# Patient Record
Sex: Male | Born: 1970 | Race: White | Hispanic: No | Marital: Married | Smoking: Former smoker
Health system: Southern US, Community
[De-identification: ages and names within clinical notes are randomized; demographics above are authoritative.]

## PROBLEM LIST (undated history)

## (undated) DIAGNOSIS — K5792 Diverticulitis of intestine, part unspecified, without perforation or abscess without bleeding: Secondary | ICD-10-CM

## (undated) HISTORY — PX: MOUTH SURGERY: SHX715

---

## 2011-07-27 DIAGNOSIS — K5792 Diverticulitis of intestine, part unspecified, without perforation or abscess without bleeding: Secondary | ICD-10-CM

## 2011-07-27 HISTORY — DX: Diverticulitis of intestine, part unspecified, without perforation or abscess without bleeding: K57.92

## 2014-04-10 ENCOUNTER — Emergency Department (HOSPITAL_COMMUNITY)
Admission: EM | Admit: 2014-04-10 | Discharge: 2014-04-10 | Disposition: A | Discharge status: Home / Self Care | Payer: Self-pay | Attending: Emergency Medicine | Admitting: Emergency Medicine

## 2014-04-10 ENCOUNTER — Encounter (HOSPITAL_COMMUNITY): Payer: Self-pay | Admitting: Emergency Medicine

## 2014-04-10 DIAGNOSIS — K625 Hemorrhage of anus and rectum: Secondary | ICD-10-CM | POA: Insufficient documentation

## 2014-04-10 DIAGNOSIS — K612 Anorectal abscess: Secondary | ICD-10-CM | POA: Insufficient documentation

## 2014-04-10 DIAGNOSIS — K611 Rectal abscess: Secondary | ICD-10-CM

## 2014-04-10 DIAGNOSIS — K6289 Other specified diseases of anus and rectum: Secondary | ICD-10-CM | POA: Insufficient documentation

## 2014-04-10 HISTORY — DX: Diverticulitis of intestine, part unspecified, without perforation or abscess without bleeding: K57.92

## 2014-04-10 MED ORDER — CLINDAMYCIN HCL 150 MG PO CAPS: 150.0000 mg | ORAL_CAPSULE | Freq: Four times a day (QID) | ORAL | Status: DC

## 2014-04-10 NOTE — ED Provider Notes (Signed)
CSN: [id number]     Arrival date & time 04/10/14  0758 History   First MD Initiated Contact with Patient 04/10/14 [phone]     Chief Complaint  Patient presents with  . Rectal Pain  . Rectal Bleeding     (Consider location/radiation/quality/duration/timing/severity/associated sxs/prior Treatment) HPI  43 year old male presents c/o rectal pain and bleeding.  Patient reports gradual onset of rectal pain and bleeding for the past 5 days. He was seen at local hospital for this complaint 3 days ago and was diagnosed with internal hemorrhoid. He was instructed to take Preparation H, sitz baths, stool softener for this complaint. He however noticed increasing pain, rectal bleeding along with pustular discharge from rectum. Increasing pain with sitting down and with bowel movement. No associated fever, abdominal pain, back pain, or rash. He has history of external hemorrhoid. He has history of recurrent boils.  No history of cancer, no abnormal weight changes, night sweats, fever.  Past Medical History  Diagnosis Date  . Diverticulitis    History reviewed. No pertinent past surgical history. History reviewed. No pertinent family history. History  Substance Use Topics  . Smoking status: Not on file  . Smokeless tobacco: Not on file  . Alcohol Use: No    Review of Systems  Constitutional: Negative for fever.  Gastrointestinal: Positive for anal bleeding and rectal pain.  Skin: Negative for rash and wound.  Neurological: Negative for numbness.      Allergies  Review of patient's allergies indicates no known allergies.  Home Medications   Prior to Admission medications   Not on File   BP 124/67  Pulse 94  Temp(Src) 98.5 F (36.9 C) (Oral)  Resp 18  SpO2 100% Physical Exam  Constitutional: He appears well-developed and well-nourished. No distress.  HENT:  Head: Atraumatic.  Eyes: Conjunctivae are normal.  Neck: Normal range of motion. Neck supple.  Abdominal: Soft. There is no  tenderness.  Genitourinary:  Rectum: perirectal abscess at the 12 o'clock position, exquisite tenderness, pustular discharge mix with blood on manipulation.  Difficult to continue with examination due to pt's discomfort but pt has rectal tone and no other obvious mass  Neurological: He is alert.  Skin: No rash noted.  Psychiatric: He has a normal mood and affect.    ED Course  Procedures (including critical care time)  9:46 AM Pt has perirectal abscess.  I gave option of I&D in the ER vs. Being treated by [facility] for more of a definitely treatment.  Pt understand that perirectal abscess may have a higher rate of re accumulation when excise in ED due to it's complication.  Pt prefers [facility] treatment.  Will d/c with abx, clindamycin, recommend continue with sitz bath and preparation H.  Return precaution discussed.  Pt agrees with plan.    Labs Review Labs Reviewed - No data to display  Imaging Review No results found.   EKG Interpretation None      MDM   Final diagnoses:  Perirectal abscess    BP 124/67  Pulse 94  Temp(Src) 98.5 F (36.9 C) (Oral)  Resp 18  SpO2 100%     Fayrene Helper, PA-C 04/10/14 1010

## 2014-04-10 NOTE — ED Notes (Signed)
Reports having severe rectal pain and bleeding for several days. Has been seen by dr in [employer] and told it was hemorrhoids.

## 2014-04-10 NOTE — Discharge Instructions (Signed)
Please follow up closely with [facility] for further management of your peri-rectal abscess.  Continue with sitz bath, PreparationH, stool softener.  Return if your condition worsen or if you have other concerns.    Peri-Rectal Abscess Your caregiver has diagnosed you as having a peri-rectal abscess. This is an infected area near the rectum that is filled with pus. If the abscess is near the surface of the skin, your caregiver may open (incise) the area and drain the pus. HOME CARE INSTRUCTIONS   If your abscess was opened up and drained. A small piece of gauze may be placed in the opening so that it can drain. Do not remove the gauze unless directed by your caregiver.  A loose dressing may be placed over the abscess site. Change the dressing as often as necessary to keep it clean and dry.  After the drain is removed, the area may be washed with a gentle antiseptic (soap) four times per day.  A warm sitz bath, warm packs or heating pad may be used for pain relief, taking care not to burn yourself.  Return for a wound check in 1 day or as directed.  An "inflatable doughnut" may be used for sitting with added comfort. These can be purchased at a drugstore or medical supply house.  To reduce pain and straining with bowel movements, eat a high fiber diet with plenty of fruits and vegetables. Use stool softeners as recommended by your caregiver. This is especially important if narcotic type pain medications were prescribed as these may cause marked constipation.  Only take over-the-counter or prescription medicines for pain, discomfort, or fever as directed by your caregiver. SEEK IMMEDIATE MEDICAL CARE IF:   You have increasing pain that is not controlled by medication.  There is increased inflammation (redness), swelling, bleeding, or drainage from the area.  An oral temperature above 102 F (38.9 C) develops.  You develop chills or generalized malaise (feel lethargic or feel  "washed out").  You develop any new symptoms (problems) you feel may be related to your present problem. Document Released: 07/09/2000 Document Revised: 10/04/2011 Document Reviewed: 07/09/2008 [facility] Patient Information 2015 [place], [place]. This information is not intended to replace advice given to you by your health care provider. Make sure you discuss any questions you have with your health care provider.

## 2014-04-11 NOTE — ED Provider Notes (Signed)
Medical screening examination/treatment/procedure(s) were performed by non-physician practitioner and as supervising physician I was immediately available for consultation/collaboration.   EKG Interpretation None        Shon Baton, MD 04/11/14 [phone]

## 2014-04-12 ENCOUNTER — Emergency Department (HOSPITAL_COMMUNITY): Payer: Self-pay

## 2014-04-12 ENCOUNTER — Emergency Department (HOSPITAL_COMMUNITY)
Admission: EM | Admit: 2014-04-12 | Discharge: 2014-04-12 | Disposition: A | Discharge status: Home / Self Care | Payer: Self-pay | Attending: Emergency Medicine | Admitting: Emergency Medicine

## 2014-04-12 ENCOUNTER — Encounter (HOSPITAL_COMMUNITY): Payer: Self-pay | Admitting: Emergency Medicine

## 2014-04-12 DIAGNOSIS — Z87891 Personal history of nicotine dependence: Secondary | ICD-10-CM | POA: Insufficient documentation

## 2014-04-12 DIAGNOSIS — K6289 Other specified diseases of anus and rectum: Secondary | ICD-10-CM | POA: Insufficient documentation

## 2014-04-12 DIAGNOSIS — K5732 Diverticulitis of large intestine without perforation or abscess without bleeding: Secondary | ICD-10-CM | POA: Insufficient documentation

## 2014-04-12 LAB — CBC
HEMATOCRIT: 41.7 % (ref 39.0–52.0)
Hemoglobin: 14.3 g/dL (ref 13.0–17.0)
MCH: 28.7 pg (ref 26.0–34.0)
MCHC: 34.3 g/dL (ref 30.0–36.0)
MCV: 83.6 fL (ref 78.0–100.0)
PLATELETS: 250 10*3/uL (ref 150–400)
RBC: 4.99 MIL/uL (ref 4.22–5.81)
RDW: 12.3 % (ref 11.5–15.5)
WBC: 7.7 10*3/uL (ref 4.0–10.5)

## 2014-04-12 LAB — BASIC METABOLIC PANEL
ANION GAP: 9 (ref 5–15)
BUN: 13 mg/dL (ref 6–23)
CHLORIDE: 102 meq/L (ref 96–112)
CO2: 30 mEq/L (ref 19–32)
Calcium: 9.4 mg/dL (ref 8.4–10.5)
Creatinine, Ser: 0.85 mg/dL (ref 0.50–1.35)
GFR calc non Af Amer: >90 mL/min (ref >90)
Glucose, Bld: 91 mg/dL (ref 70–99)
Potassium: 4.8 mEq/L (ref 3.7–5.3)
Sodium: 141 mEq/L (ref 137–147)

## 2014-04-12 MED ORDER — TRAMADOL HCL 50 MG PO TABS: 50.0000 mg | ORAL_TABLET | Freq: Four times a day (QID) | ORAL | Status: DC | PRN

## 2014-04-12 MED ORDER — LIDOCAINE 5 % EX OINT
TOPICAL_OINTMENT | Freq: Once | CUTANEOUS | Status: AC
  Administered 2014-04-12: 13:00:00 via TOPICAL
  Filled 2014-04-12 (×2): qty 35.44

## 2014-04-12 MED ORDER — ONDANSETRON HCL 4 MG/2ML IJ SOLN
4.0000 mg | Freq: Once | INTRAMUSCULAR | Status: AC
  Administered 2014-04-12: 4 mg via INTRAVENOUS
  Filled 2014-04-12: qty 2

## 2014-04-12 MED ORDER — IOHEXOL 300 MG/ML  SOLN
100.0000 mL | Freq: Once | INTRAMUSCULAR | Status: AC | PRN
  Administered 2014-04-12: 100 mL via INTRAVENOUS

## 2014-04-12 MED ORDER — MORPHINE SULFATE 4 MG/ML IJ SOLN
4.0000 mg | Freq: Once | INTRAMUSCULAR | Status: DC
  Filled 2014-04-12: qty 1

## 2014-04-12 MED ORDER — SODIUM CHLORIDE 0.9 % IV BOLUS (SEPSIS)
1000.0000 mL | Freq: Once | INTRAVENOUS | Status: AC
  Administered 2014-04-12: 1000 mL via INTRAVENOUS

## 2014-04-12 NOTE — ED Notes (Signed)
Pt was to follow up with [facility]-- but office won't make an appt--due to insurance issues. They referred pt to health and wellness clinic. Has not received a phone call from clinic yet. Pt in severe pain, has been taking antibiotics and has received some relief. Open and draining at present. Using a doughnut ring for comfort.

## 2014-04-12 NOTE — ED Provider Notes (Signed)
History/physical exam/procedure(s) were performed by non-physician practitioner and as supervising physician I was immediately available for consultation/collaboration. I have reviewed all notes and am in agreement with care and plan.   Hilario Quarry, MD 04/12/14 [phone]

## 2014-04-12 NOTE — ED Provider Notes (Signed)
CSN: [id number]     Arrival date & time 04/12/14  4098 History   First MD Initiated Contact with Patient 04/12/14 0920     Chief Complaint  Patient presents with  . Rectal Pain     (Consider location/radiation/quality/duration/timing/severity/associated sxs/prior Treatment) HPI  Perry Hatfield 43 year old male who presents emergency department on a subsequent visit for rectal abscess. Patient was seen 2 days ago was started on oral clindamycin and discharged to followup with [facility]. Patient is uninsured and could not afford expected initial payment to be seen in the office. Patient returns today with continued severe pain, pain with defecation.  He states that pressure is somewhat improved and the abscess is draining. Patient endorses, nausea after beginning clinda, no abdominal pain, diarrhea or vomiting.  Past Medical History  Diagnosis Date  . Diverticulitis    History reviewed. No pertinent past surgical history. No family history on file. History  Substance Use Topics  . Smoking status: Former Games developer  . Smokeless tobacco: Not on file  . Alcohol Use: No    Review of Systems  ,Ten systems are reviewed and are negative for acute change except as noted in the HPI   Allergies  Review of patient's allergies indicates no known allergies.  Home Medications   Prior to Admission medications   Medication Sig Start Date End Date Taking? Authorizing Provider  clindamycin (CLEOCIN) 150 MG capsule Take 1 capsule (150 mg total) by mouth every 6 (six) hours. 04/10/14   Fayrene Helper, PA-C   BP 124/76  Pulse 68  Temp(Src) 98.1 F (36.7 C) (Oral)  Resp 18  Ht  (1.905 m)  Wt 231 lb 2 oz (104.838 kg)  BMI 28.89 kg/m2  SpO2 98% Physical Exam  Nursing note and vitals reviewed. Constitutional: He appears well-developed and well-nourished. No distress.  HENT:  Head: Normocephalic and atraumatic.  Eyes: Conjunctivae are normal. No scleral icterus.  Neck: Normal  range of motion. Neck supple.  Cardiovascular: Normal rate, regular rhythm and normal heart sounds.   Pulmonary/Chest: Effort normal and breath sounds normal. No respiratory distress.  Abdominal: Soft. There is no tenderness.  Genitourinary:  Indurated lump at 6 o'clock  Musculoskeletal: He exhibits no edema.  Neurological: He is alert.  Skin: Skin is warm and dry. He is not diaphoretic.  Psychiatric: His behavior is normal.    ED Course  Procedures (including critical care time) Labs Review Labs Reviewed  CBC  BASIC METABOLIC PANEL    Imaging Review No results found.   EKG Interpretation None      MDM   Final diagnoses:  Rectal pain   9:51 AM BP 124/76  Pulse 68  Temp(Src) 98.1 F (36.7 C) (Oral)  Resp 18  Ht  (1.905 m)  Wt 231 lb 2 oz (104.838 kg)  BMI 28.89 kg/m2  SpO2 98% Patient with rectal abscess. Labs, pain control and pelvic CT to r/o deeper abscess.Patient / Family / Caregiver understand and agree with initial ED impression and plan with expectations set for ED visit.   12:18 PM BP 112/50  Pulse 62  Temp(Src) 98.1 F (36.7 C) (Oral)  Resp 14  Ht  (1.905 m)  Wt 231 lb 2 oz (104.838 kg)  BMI 28.89 kg/m2  SpO2 100% Patient with resolving. Rectal abscess. No visible signs of abnormality on CT scan. Patient will be given lidocaine ointment and discharged with tramadol to add to his pain medication. He may follow up with the health  and wellness center as directed. Continue clindamycin. Discharge instructions given and return precautions discussed.  Arthor Captain, PA-C 04/12/14 1219

## 2014-04-12 NOTE — Discharge Planning (Signed)
[facility] Community Liaison  Follow up appointment made by this liaison for 04/19/2014 at 11:15 with the [facility]. Pt and family at bedside verbalized understanding of the upcoming appointment. Resource guide and my contact information provided for any future questions or concerns. No other Community Liaison needs identified at this time.

## 2014-04-12 NOTE — Discharge Instructions (Signed)
Please use the lidocaine no more than 3 times a day. There were no signs of active abscess on the CT scan and it appears to be improving.  Please continue to take your clindamycin. You may take the tramadol and the other pain medicine you've  Been given for pain relief. Follow up  As direted with the [facility].  Peri-Rectal Abscess Your caregiver has diagnosed you as having a peri-rectal abscess. This is an infected area near the rectum that is filled with pus. If the abscess is near the surface of the skin, your caregiver may open (incise) the area and drain the pus. HOME CARE INSTRUCTIONS   If your abscess was opened up and drained. A small piece of gauze may be placed in the opening so that it can drain. Do not remove the gauze unless directed by your caregiver.  A loose dressing may be placed over the abscess site. Change the dressing as often as necessary to keep it clean and dry.  After the drain is removed, the area may be washed with a gentle antiseptic (soap) four times per day.  A warm sitz bath, warm packs or heating pad may be used for pain relief, taking care not to burn yourself.  Return for a wound check in 1 day or as directed.  An "inflatable doughnut" may be used for sitting with added comfort. These can be purchased at a drugstore or medical supply house.  To reduce pain and straining with bowel movements, eat a high fiber diet with plenty of fruits and vegetables. Use stool softeners as recommended by your caregiver. This is especially important if narcotic type pain medications were prescribed as these may cause marked constipation.  Only take over-the-counter or prescription medicines for pain, discomfort, or fever as directed by your caregiver. SEEK IMMEDIATE MEDICAL CARE IF:   You have increasing pain that is not controlled by medication.  There is increased inflammation (redness), swelling, bleeding, or drainage from the area.  An  oral temperature above 102 F (38.9 C) develops.  You develop chills or generalized malaise (feel lethargic or feel "washed out").  You develop any new symptoms (problems) you feel may be related to your present problem. Document Released: 07/09/2000 Document Revised: 10/04/2011 Document Reviewed: 07/09/2008 [facility] Patient Information 2015 [place], [place]. This information is not intended to replace advice given to you by your health care provider. Make sure you discuss any questions you have with your health care provider.

## 2014-04-12 NOTE — ED Notes (Signed)
Pt c/o rectal pain onset Last Saturday. On Sunday pt began to have discharge from rectal area. Pt seen by PMD on Monday and was told he had a Hemorid. Pt seen here and told he has a perirectal abscess. Pt was referred to a surgeon but due to no insurance he could not be seen.

## 2014-04-19 ENCOUNTER — Ambulatory Visit: Payer: Self-pay | Attending: Family Medicine | Admitting: Family Medicine

## 2014-04-19 ENCOUNTER — Encounter: Payer: Self-pay | Admitting: Family Medicine

## 2014-04-19 VITALS — BP 103/69 | HR 67 | Temp 98.2°F | Resp 18 | Ht 75.0 in | Wt 231.0 lb

## 2014-04-19 DIAGNOSIS — L03317 Cellulitis of buttock: Principal | ICD-10-CM

## 2014-04-19 DIAGNOSIS — Z87891 Personal history of nicotine dependence: Secondary | ICD-10-CM | POA: Insufficient documentation

## 2014-04-19 DIAGNOSIS — K602 Anal fissure, unspecified: Secondary | ICD-10-CM | POA: Insufficient documentation

## 2014-04-19 DIAGNOSIS — R935 Abnormal findings on diagnostic imaging of other abdominal regions, including retroperitoneum: Secondary | ICD-10-CM | POA: Insufficient documentation

## 2014-04-19 DIAGNOSIS — L0231 Cutaneous abscess of buttock: Secondary | ICD-10-CM | POA: Insufficient documentation

## 2014-04-19 MED ORDER — DOXYCYCLINE HYCLATE 100 MG PO TABS: 100.0000 mg | ORAL_TABLET | Freq: Two times a day (BID) | ORAL | Status: DC

## 2014-04-19 NOTE — Addendum Note (Signed)
Addended by: Dessa Phi on: 04/19/2014 02:08 PM   Modules accepted: Level of Service

## 2014-04-19 NOTE — Progress Notes (Signed)
Establish Care HFU Abscess on rectal area, pt state has pain with BM

## 2014-04-19 NOTE — Patient Instructions (Addendum)
Perry Hatfield,  Thank you for coming in today. It was a pleasure meeting you. I look forward to being your primary doctor.  Abscess is improving:  Plan Change from clindamycin to doxycyline since not tolerating clindamycin Continue stool softener with goal of 1 soft bowel movement daily.   Return in 2-3 weeks for recheck, sooner if needed   Will plan for f/u CT pelvis in 4-6 weeks  Dr. Karie Fetch Fissure, Adult An anal fissure is a small tear or crack in the skin around the anus. Bleeding from a fissure usually stops on its own within a few minutes. However, bleeding will often reoccur with each bowel movement until the crack heals.  CAUSES   Passing large, hard stools.  Frequent diarrheal stools.  Constipation.  Inflammatory bowel disease (Crohn's disease or ulcerative colitis).  Infections.  Anal sex. SYMPTOMS   Small amounts of blood seen on your stools, on toilet paper, or in the toilet after a bowel movement.  Rectal bleeding.  Painful bowel movements.  Itching or irritation around the anus. DIAGNOSIS Your caregiver will examine the anal area. An anal fissure can usually be seen with careful inspection. A rectal exam may be performed and a short tube (anoscope) may be used to examine the anal canal. TREATMENT   You may be instructed to take fiber supplements. These supplements can soften your stool to help make bowel movements easier.  Sitz baths may be recommended to help heal the tear. Do not use soap in the sitz baths.  A medicated cream or ointment may be prescribed to lessen discomfort. HOME CARE INSTRUCTIONS   Maintain a diet high in fruits, whole grains, and vegetables. Avoid constipating foods like bananas and dairy products.  Take sitz baths as directed by your caregiver.  Drink enough fluids to keep your urine clear or pale yellow.  Only take over-the-counter or prescription medicines for pain, discomfort, or fever as directed by your  caregiver. Do not take aspirin as this may increase bleeding.  Do not use ointments containing numbing medications (anesthetics) or hydrocortisone. They could slow healing. SEEK MEDICAL CARE IF:   Your fissure is not completely healed within 3 days.  You have further bleeding.  You have a fever.  You have diarrhea mixed with blood.  You have pain.  Your problem is getting worse rather than better. MAKE SURE YOU:   Understand these instructions.  Will watch your condition.  Will get help right away if you are not doing well or get worse. Document Released: 07/12/2005 Document Revised: 10/04/2011 Document Reviewed: 12/27/2010 [facility] Patient Information 2015 [place], [place]. This information is not intended to replace advice given to you by your health care provider. Make sure you discuss any questions you have with your health care provider.

## 2014-04-19 NOTE — Progress Notes (Signed)
   Subjective:    Patient ID: Perry Hatfield, male    DOB: 07/17/1971, 43 y.o.   MRN: [id number] CC: establish care, ED f/u for perirectal abscess  HPI 43 year old male presents to establish care discussed the following:  #1 perirectal abscess: Patient with apparent absence of symptoms started on 04/10/2014. He noticed pain and bleeding from his rectum. He was initially diagnosed with internal hemorrhoids. His been diagnosed with perirectal abscess. There is draining spontaneously. He is taking a few clindamycin is not taking as prescribed due to GI upset. No fever, chills, nausea, vomiting. Pain is greatly improved.  Soc hx: former smoker, quit  Review of Systems As per HPI     Objective:   Physical Exam BP 103/69  Pulse 67  Temp(Src) 98.2 F (36.8 C) (Oral)  Resp 18  Ht  (1.905 m)  Wt 231 lb (104.781 kg)  BMI 28.87 kg/m2  SpO2 98% General appearance: alert, cooperative and no distress Rectal: indurated area at 12 o'clock perirectal, no drainage, no fluctuance, fissure at 12 o'clock, increased rectal tone.    CT Pelvis with Contrast 04/12/14: Negative for perirectal abscess  Sigmoid diverticulosis  Questionable area in the right lateral wall the rectum measuring 1 cm which shows hyper enhancement. This could be normal tissue or possibly a polyp or mass.      Assessment & Plan:

## 2014-04-19 NOTE — Assessment & Plan Note (Signed)
A: persistent area of induration, no fluctuance, pain significantly improved. Fissure/draining tract opening noted on exam.  P: Change from clindamycin to doxycyline since not tolerating clindamycin Continue stool softener with goal of 1 soft bowel movement daily.  Return in 2-3 weeks for recheck, sooner if needed   Will get f/u CT pelvis with contrast in 4-6 weeks to re-evaluate are of concerns on R lateral rectum

## 2014-05-06 ENCOUNTER — Ambulatory Visit: Payer: Self-pay | Admitting: Family Medicine

## 2014-05-17 ENCOUNTER — Encounter: Payer: Self-pay | Admitting: Family Medicine

## 2014-05-17 ENCOUNTER — Ambulatory Visit (HOSPITAL_COMMUNITY): Payer: BC Managed Care – PPO

## 2014-05-17 ENCOUNTER — Ambulatory Visit: Payer: BC Managed Care – PPO | Attending: Family Medicine | Admitting: Family Medicine

## 2014-05-17 VITALS — BP 124/78 | HR 77 | Temp 97.9°F | Resp 18 | Ht 73.5 in | Wt 233.0 lb

## 2014-05-17 DIAGNOSIS — Z87891 Personal history of nicotine dependence: Secondary | ICD-10-CM | POA: Insufficient documentation

## 2014-05-17 DIAGNOSIS — K649 Unspecified hemorrhoids: Secondary | ICD-10-CM | POA: Insufficient documentation

## 2014-05-17 DIAGNOSIS — L0231 Cutaneous abscess of buttock: Secondary | ICD-10-CM | POA: Diagnosis not present

## 2014-05-17 DIAGNOSIS — Z6830 Body mass index (BMI) 30.0-30.9, adult: Secondary | ICD-10-CM | POA: Insufficient documentation

## 2014-05-17 DIAGNOSIS — J069 Acute upper respiratory infection, unspecified: Secondary | ICD-10-CM | POA: Insufficient documentation

## 2014-05-17 DIAGNOSIS — K643 Fourth degree hemorrhoids: Secondary | ICD-10-CM

## 2014-05-17 DIAGNOSIS — H9201 Otalgia, right ear: Secondary | ICD-10-CM | POA: Insufficient documentation

## 2014-05-17 DIAGNOSIS — K644 Residual hemorrhoidal skin tags: Secondary | ICD-10-CM | POA: Diagnosis not present

## 2014-05-17 NOTE — Progress Notes (Signed)
   Subjective:    Patient ID: Perry Hatfield, male    DOB: [birth year], 43 y.o.   MRN: [id number] CC: f/u rectal bleeding in the setting of gluteal abscess, ? Ear infection  HPI 43 yo M presents for f/u visit:  1. Gluteal abscess: had return of drainage, scant, bloody. Intermittent sharp pains. No fever. Still with hard stools but improved with daily stool softener.   2. R ear ache: x 3 days. No drainage. No fever. Also with mild sore throat. No exudate. Sick contact of daughter at home. Taking honey.   Soc hx: former smoker, quit  Review of Systems As per HPI      Objective:   Physical Exam BP 124/78  Pulse 77  Temp(Src) 97.9 F (36.6 C) (Oral)  Resp 18  Ht 6' 1.5" (1.867 m)  Wt 233 lb (105.688 kg)  BMI 30.32 kg/m2  SpO2 97% Wt Readings from Last 3 Encounters:  05/17/14 233 lb (105.688 kg)  04/19/14 231 lb (104.781 kg)  04/12/14 231 lb 2 oz (104.838 kg)  General appearance: alert, cooperative and no distress Ears: normal TM's and external ear canals both ears Nose: no discharge, turbinates pink, swollen Throat: lips, mucosa, and tongue normal; teeth and gums normal Gluteal: external hemorrhoids w/o thrombosis, no active bleeding. No skin tears.       Assessment & Plan:

## 2014-05-17 NOTE — Assessment & Plan Note (Signed)
A; hemorrhoids with intermittent symptoms no thrombosis P: Stool softener Sitz baths Tucks Referral to gen surg prn worsening symptoms

## 2014-05-17 NOTE — Patient Instructions (Signed)
Mr. Adriana SimasCook,  Thank you for coming back for follow up.  1. Please go for CT pelvis to f/u resolution of abscess.   2. Hemorrhoids: you have hemorrhoids on exam. Tucks wipes, sitz baths, stool softeners. If you have worsening pain, bleeding or fecal leakage I recommend referral to general surgery and will be happy to place it.   3. Upper respiratory infection: Continue honey, lemon, tea rest.  Tylenol or motrin for pain.  F/u as needed if symptoms worsen.   Dr. Armen PickupFunches

## 2014-05-17 NOTE — Assessment & Plan Note (Signed)
A: mild symptoms P:  Supportive care

## 2014-05-17 NOTE — Assessment & Plan Note (Addendum)
A: resolved based on exam. P:  F/u CT scan  Repeat BMP today in anticipation of CT with contrast.

## 2014-05-17 NOTE — Progress Notes (Signed)
F/U rectal bleeding Complaining of possible ear infection

## 2014-05-18 LAB — BASIC METABOLIC PANEL
BUN: 11 mg/dL (ref 6–23)
CO2: 29 mEq/L (ref 19–32)
Calcium: 9.1 mg/dL (ref 8.4–10.5)
Chloride: 104 mEq/L (ref 96–112)
Creat: 0.78 mg/dL (ref 0.50–1.35)
Glucose, Bld: 73 mg/dL (ref 70–99)
POTASSIUM: 4.2 meq/L (ref 3.5–5.3)
Sodium: 141 mEq/L (ref 135–145)

## 2014-05-20 ENCOUNTER — Telehealth: Payer: Self-pay | Admitting: *Deleted

## 2014-05-20 NOTE — Telephone Encounter (Signed)
Message copied by Dyann KiefGIRALDEZ, Preslea Rhodus M on Mon May 20, 2014  2:51 PM ------      Message from: [name], JOSALYN      Created: Mon May 20, 2014  2:10 PM       Normal BMP (specifically Cr) patient ok to have CT pelvis with contrast ------

## 2014-05-20 NOTE — Telephone Encounter (Signed)
Unable to contact patient, voicemail full

## 2014-05-24 ENCOUNTER — Ambulatory Visit (HOSPITAL_COMMUNITY): Payer: BC Managed Care – PPO

## 2014-05-31 ENCOUNTER — Telehealth: Payer: Self-pay | Admitting: Family Medicine

## 2014-05-31 ENCOUNTER — Ambulatory Visit (HOSPITAL_COMMUNITY)
Admission: RE | Admit: 2014-05-31 | Discharge: 2014-05-31 | Disposition: A | Discharge status: Home / Self Care | Payer: BC Managed Care – PPO | Source: Ambulatory Visit | Attending: Family Medicine | Admitting: Family Medicine

## 2014-05-31 DIAGNOSIS — L0231 Cutaneous abscess of buttock: Secondary | ICD-10-CM | POA: Insufficient documentation

## 2014-05-31 DIAGNOSIS — R935 Abnormal findings on diagnostic imaging of other abdominal regions, including retroperitoneum: Secondary | ICD-10-CM

## 2014-05-31 DIAGNOSIS — K5730 Diverticulosis of large intestine without perforation or abscess without bleeding: Secondary | ICD-10-CM

## 2014-05-31 MED ORDER — IOHEXOL 300 MG/ML  SOLN
100.0000 mL | Freq: Once | INTRAMUSCULAR | Status: AC | PRN
Start: 2014-05-31 — End: 2014-05-31
  Administered 2014-05-31: 100 mL via INTRAVENOUS

## 2014-05-31 NOTE — Assessment & Plan Note (Signed)
A: persistent on f/u CT no diverticulitis P: GI referral

## 2014-05-31 NOTE — Telephone Encounter (Signed)
Called patient. We discussed CT findings. "Continued presence of 14 x 9 mm oval-shaped soft tissue density in the rectum which does not appear to be significantly changed compared to prior exam. While this may represent a prominent rectal fold, polyp cannot be excluded and further evaluation with sigmoidoscopy is recommended."  Patent also noted to have persistent sigmoid diverticulosis w/o diverticulitis.   Plan: Referral to GI for sigmoidoscopy/colonoscopy Patient now has health insurance through his work.

## 2014-05-31 NOTE — Assessment & Plan Note (Addendum)
A: persistent soft tissue denisty in rectum measuring 14 x 9 mm on CT scan x 2, 8 weeks apart.  "Continued presence of 14 x 9 mm oval-shaped soft tissue density inthe rectum which does not appear to be significantly changed compared to prior exam. While this may represent a prominent rectal fold, polyp cannot be excluded and further evaluation with sigmoidoscopy is recommended" P:  Referral to GI to evaluate this area as well as the sigmoid diverticulosis

## 2014-06-06 NOTE — Telephone Encounter (Signed)
He has [employer]  04/25/2014 - 06/03/2014

## 2014-08-01 ENCOUNTER — Encounter: Payer: Self-pay | Admitting: Family Medicine

## 2014-09-05 ENCOUNTER — Ambulatory Visit: Payer: Federal, State, Local not specified - PPO | Attending: Family Medicine | Admitting: Family Medicine

## 2014-09-05 VITALS — BP 103/67 | HR 90 | Temp 98.4°F | Resp 18

## 2014-09-05 DIAGNOSIS — R509 Fever, unspecified: Secondary | ICD-10-CM | POA: Diagnosis not present

## 2014-09-05 DIAGNOSIS — Z87891 Personal history of nicotine dependence: Secondary | ICD-10-CM | POA: Insufficient documentation

## 2014-09-05 DIAGNOSIS — J209 Acute bronchitis, unspecified: Secondary | ICD-10-CM

## 2014-09-05 DIAGNOSIS — R0989 Other specified symptoms and signs involving the circulatory and respiratory systems: Secondary | ICD-10-CM | POA: Insufficient documentation

## 2014-09-05 DIAGNOSIS — R05 Cough: Secondary | ICD-10-CM | POA: Insufficient documentation

## 2014-09-05 DIAGNOSIS — F17210 Nicotine dependence, cigarettes, uncomplicated: Secondary | ICD-10-CM

## 2014-09-05 MED ORDER — AMOXICILLIN 500 MG PO CAPS: 500.0000 mg | ORAL_CAPSULE | Freq: Two times a day (BID) | ORAL | Status: AC

## 2014-09-05 MED ORDER — ALBUTEROL SULFATE HFA 108 (90 BASE) MCG/ACT IN AERS: 2.0000 | INHALATION_SPRAY | Freq: Four times a day (QID) | RESPIRATORY_TRACT | Status: AC | PRN

## 2014-09-05 MED ORDER — PREDNISONE 10 MG PO TABS: 50.0000 mg | ORAL_TABLET | Freq: Every day | ORAL | Status: AC

## 2014-09-05 NOTE — Patient Instructions (Signed)

## 2014-09-05 NOTE — Progress Notes (Signed)
Patient walked in c/o chest congestion, cough, sometimes productive of brown sputum, fever, chills, aches, ear pain L > R, nasal congestion,   Patient denies sore throat, and nasal drainage Patient states he is a former smoker ( 1+ ppd X 30 years; quit 2 years ago) Vaping daily since quit smoking Father recently dx with stage 4 lung cancer  BP 103/67 P 90 T  98.4 oral R 18 SPO2 97%  Pt c/o tenderness upon palpation of maxillary and frontal sinuses No cervical lymphadenopathy noted Ears: right canal with cerumen but without redness or drainage, left canal with cerumen and redness; tympanic membranes intact without redness or bulging Throat: clear with redness but no drainage Nose: nares red and engorged with clear drainage noted Lungs: wheezing and stridor noted on expiration both upper lobes and right middle lobe  Patient encouraged to rest, drink plenty of fluids (water), may take OTC mucinex prn  Per PCP: Prednisone 50 mg daily X 5 days Amoxicillin 500 mg bid X 10 days (Patient states diarrhea with pcn but states he does fine with amoxicillin) Albuterol MDI prn CXR in next couple of weeks F/u with PCP 7 days prn  Call back if sx worsen or fail to improve

## 2014-09-26 ENCOUNTER — Ambulatory Visit: Payer: Federal, State, Local not specified - PPO | Attending: Family Medicine | Admitting: Family Medicine

## 2014-09-26 ENCOUNTER — Telehealth: Payer: Self-pay | Admitting: *Deleted

## 2014-09-26 ENCOUNTER — Encounter: Payer: Self-pay | Admitting: Family Medicine

## 2014-09-26 ENCOUNTER — Ambulatory Visit (HOSPITAL_COMMUNITY)
Admission: RE | Admit: 2014-09-26 | Discharge: 2014-09-26 | Disposition: A | Discharge status: Home / Self Care | Payer: Federal, State, Local not specified - PPO | Source: Ambulatory Visit | Attending: Family Medicine | Admitting: Family Medicine

## 2014-09-26 VITALS — BP 118/74 | HR 60 | Temp 98.0°F | Resp 16 | Ht 72.0 in | Wt 231.0 lb

## 2014-09-26 DIAGNOSIS — R0989 Other specified symptoms and signs involving the circulatory and respiratory systems: Secondary | ICD-10-CM | POA: Insufficient documentation

## 2014-09-26 DIAGNOSIS — R05 Cough: Secondary | ICD-10-CM | POA: Insufficient documentation

## 2014-09-26 DIAGNOSIS — R5381 Other malaise: Secondary | ICD-10-CM | POA: Insufficient documentation

## 2014-09-26 DIAGNOSIS — F17210 Nicotine dependence, cigarettes, uncomplicated: Secondary | ICD-10-CM

## 2014-09-26 DIAGNOSIS — R059 Cough, unspecified: Secondary | ICD-10-CM | POA: Insufficient documentation

## 2014-09-26 DIAGNOSIS — R079 Chest pain, unspecified: Secondary | ICD-10-CM | POA: Insufficient documentation

## 2014-09-26 DIAGNOSIS — R935 Abnormal findings on diagnostic imaging of other abdominal regions, including retroperitoneum: Secondary | ICD-10-CM

## 2014-09-26 NOTE — Telephone Encounter (Signed)
Let patient know his chest xray was negative and he should continue to take otc medications for his symptom relief (per NP Concepcion LivingLinda Bernhardt)

## 2014-09-26 NOTE — Assessment & Plan Note (Signed)
Objectively: The patient is alert oriented and appropriate in no acute distress TMs appear to have a small amount of fluid there is no redness no bulging is congestion the passages do not appear overly inflamed but there is some mucus present stretch has some mild erythema throughout his neck is supple for range of motion without adenopathy. Lungs have crackles in the right lower lobe heard posteriorly and laterally. His heart sounds are regular without murmur gallop  Plan and sending him for a chest x-ray and will make treatment decisions based on those results will also give him a note for work for the rest of the week to go back on Monday

## 2014-09-26 NOTE — Progress Notes (Signed)
Patient dx 2 weeks ago with bronchitis and was given Doxycycline and prednisone and albuterol. Patient finished medications and stated he felt better for a couple of days but feels bad again C/o sinus congestions, pressure, headache an pain in his chest with cough

## 2014-10-01 ENCOUNTER — Telehealth: Payer: Self-pay | Admitting: *Deleted

## 2014-10-01 NOTE — Telephone Encounter (Signed)
-----   Message from Lora PaulaJosalyn C Funches, MD sent at 09/26/2014  9:11 PM EST ----- Normal CXR

## 2014-10-01 NOTE — Telephone Encounter (Signed)
Pt aware.

## 2014-10-15 NOTE — Progress Notes (Signed)
Patient ID: Perry Hatfield, male   DOB: April 26, 1971, 44 y.o.   MRN: [id number] I have reviewed the note and agree.  [name],[name]

## 2015-09-26 IMAGING — CT CT PELVIS W/ CM
2 of 3 series · 17 of 46 positions shown, 19 images · IV contrast (Omni 300)
Comparison: None.

CLINICAL DATA: Rectal abscess

EXAM:
CT PELVIS WITH CONTRAST
TECHNIQUE: Multidetector CT imaging of the pelvis was performed using the
standard protocol following the bolus administration of intravenous
contrast.
CONTRAST:  100mL OMNIPAQUE IOHEXOL 300 MG/ML  SOLN

[Series 2: abd/ pelvis 5.0 i30f 1 · axial · 0.70mm/px · z∈[+796,+1046]mm · 14 of 58 slices shown, 16 images]
[im 4/58  soft-tissue]
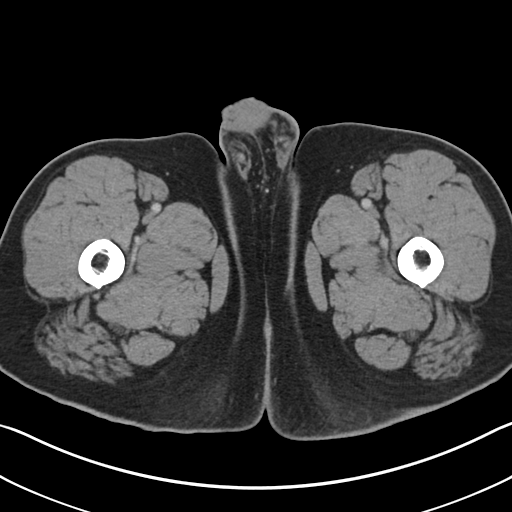
[im 4/58  bone]
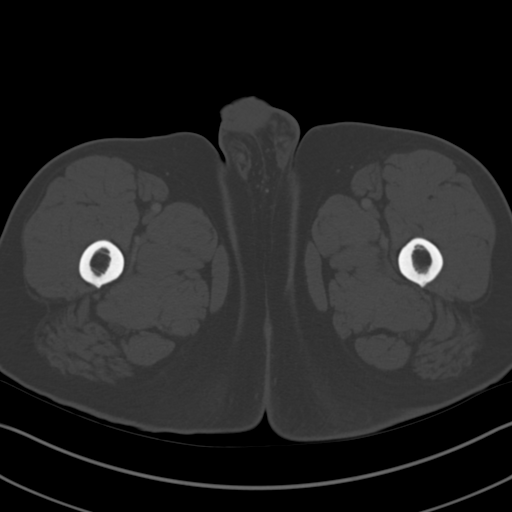
[im 8/58  soft-tissue]
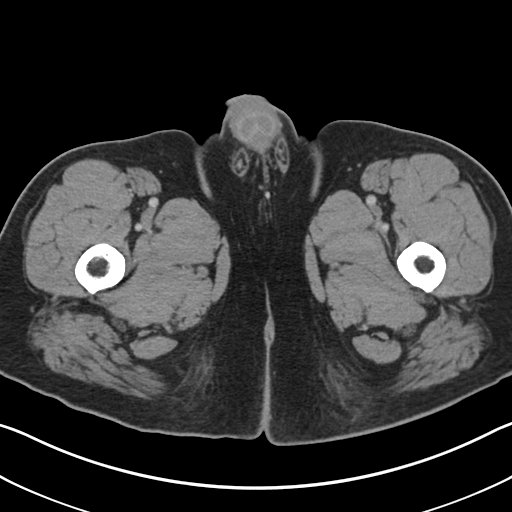
[im 12/58  soft-tissue]
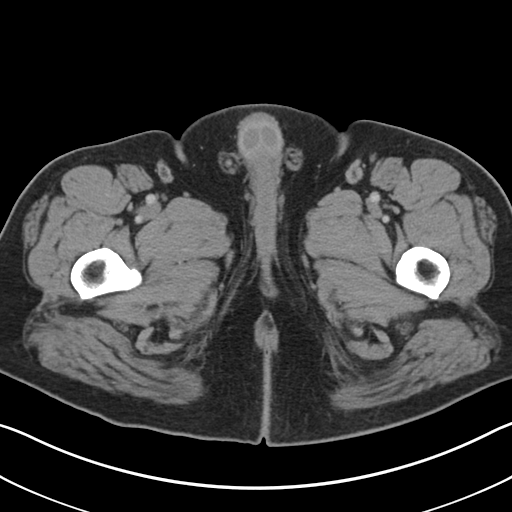
[im 15/58  soft-tissue]
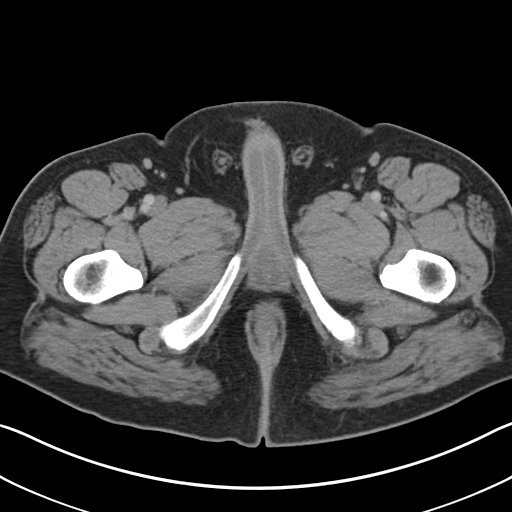
[im 19/58  soft-tissue]
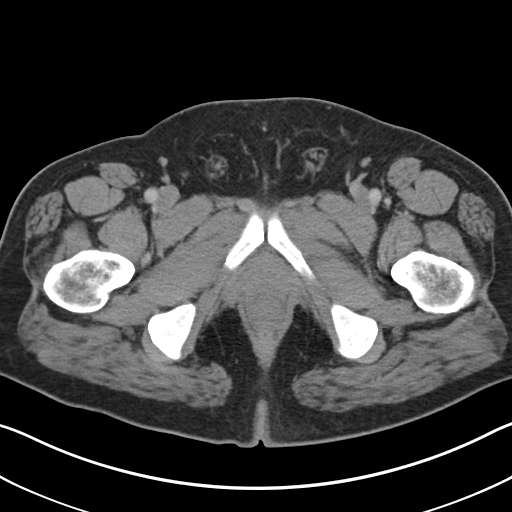
[im 23/58  soft-tissue]
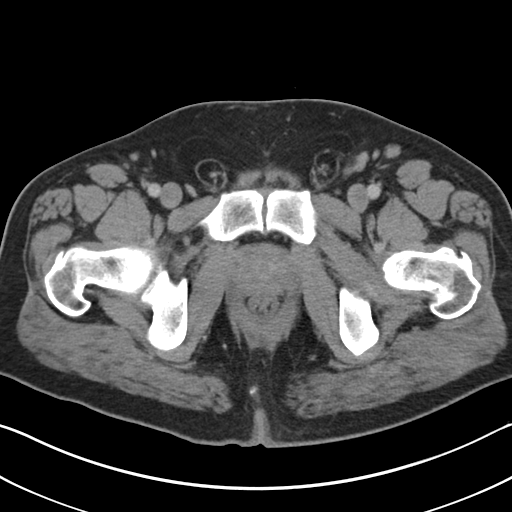
[im 26/58  soft-tissue]
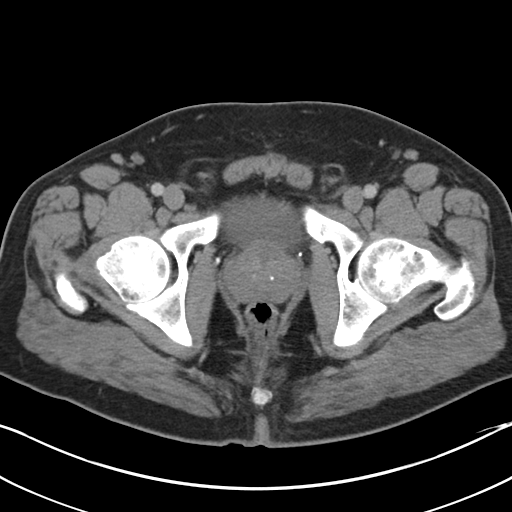
[im 32/58  soft-tissue]
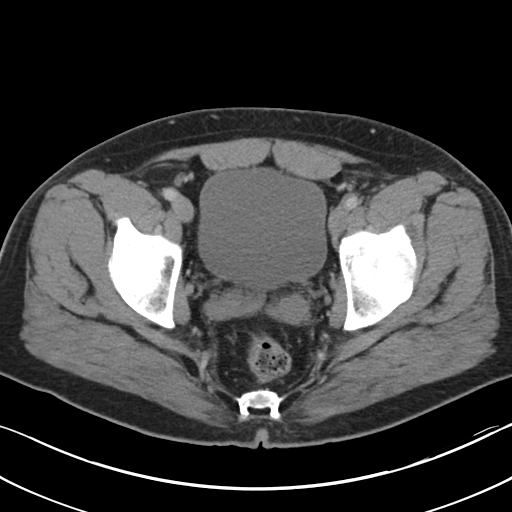
[im 35/58  soft-tissue]
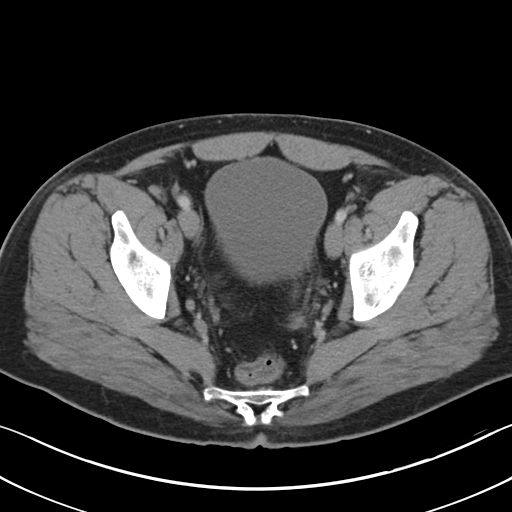
[im 35/58  bone]
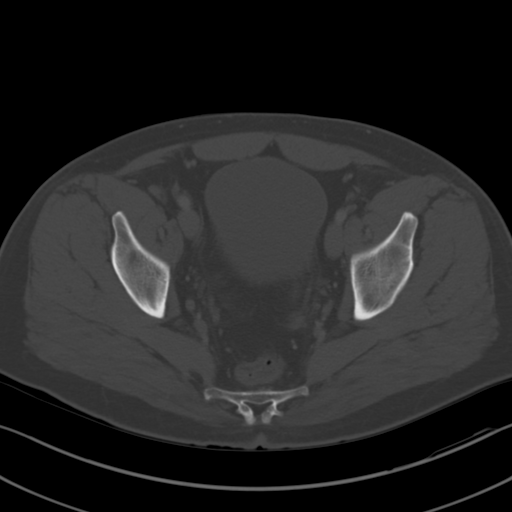
[im 39/58  soft-tissue]
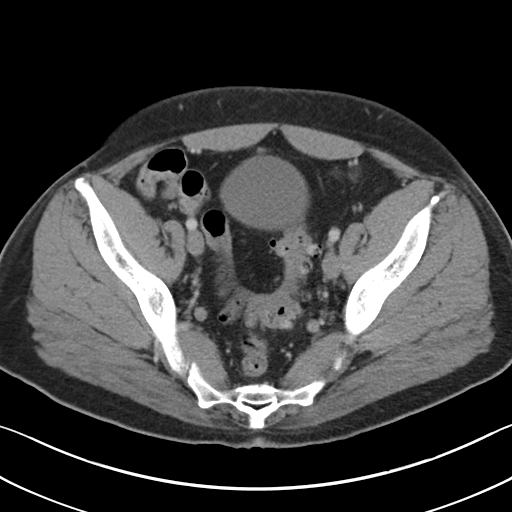
[im 43/58  soft-tissue]
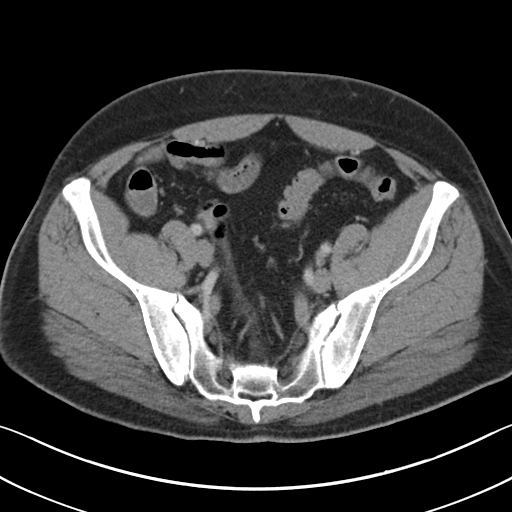
[im 46/58  soft-tissue]
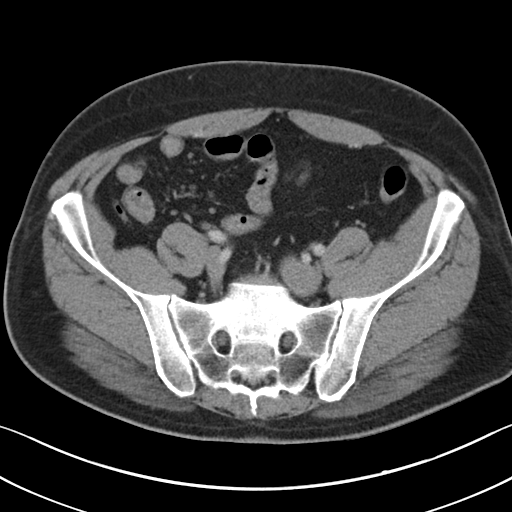
[im 50/58  soft-tissue]
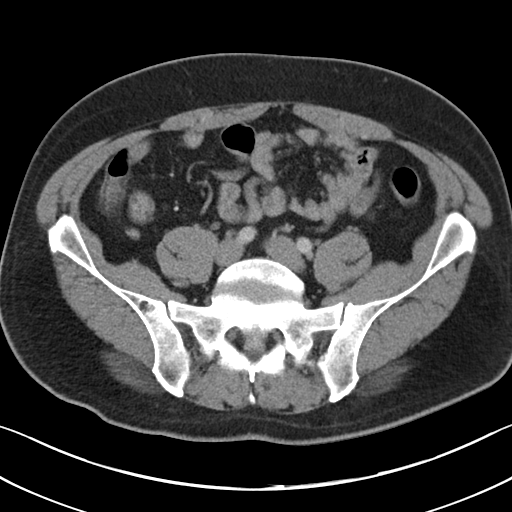
[im 54/58  soft-tissue]
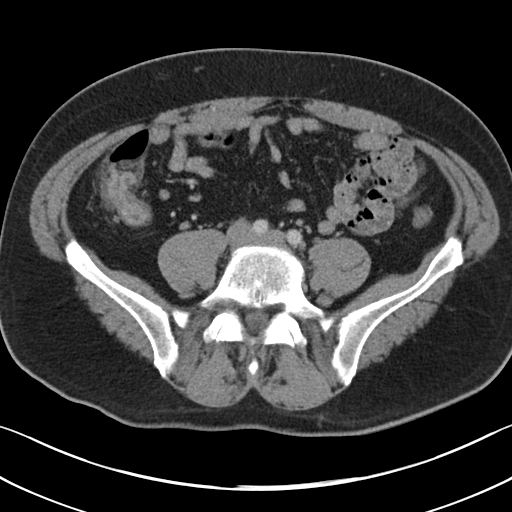

[Series 4: coronals · coronal · 0.58mm/px · 3 of 110 slices shown]
[im 37/110  soft-tissue]
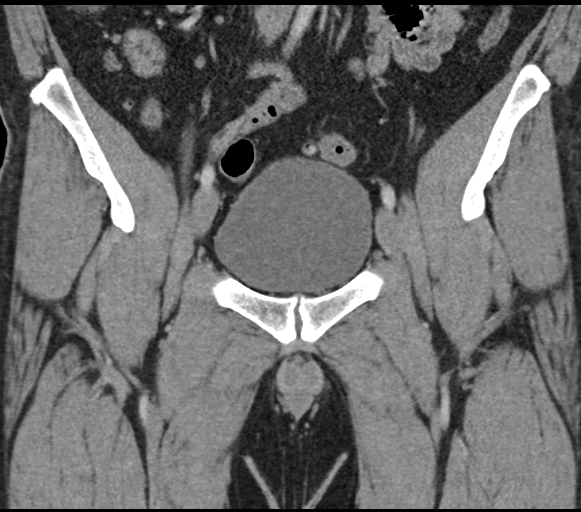
[im 49/110  soft-tissue]
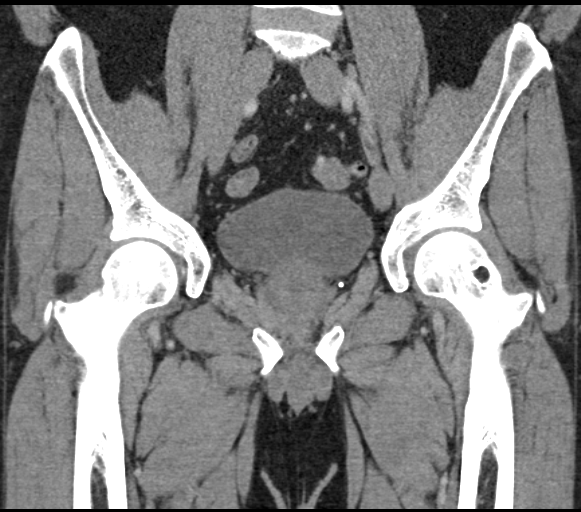
[im 61/110  soft-tissue]
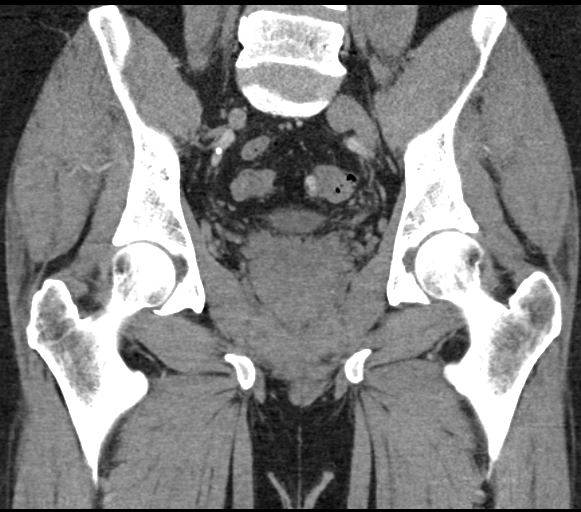

[17 of 46 positions shown; findings below may reference images not displayed]

FINDINGS: The abdomen was not scanned.

Sigmoid diverticulosis without evidence of diverticulitis.
Visualized bowel is not dilated. Appendix is normal.

1 cm area of hyper enhancement along the right lateral wall of the
rectum of questionable significance. This could represent a polyp,
mass or area of infection. No circumferential thickening of the
rectum. No perirectal abscess is identified.

The prostate is moderately large with prostate calcifications.
Urinary bladder is normal.

No adenopathy or free fluid in the pelvis.  No acute bony change.
IMPRESSION: Negative for perirectal abscess

Sigmoid diverticulosis

Questionable area in the right lateral wall the rectum measuring 1
cm which shows hyper enhancement. This could be normal tissue or
possibly a polyp or mass.

## 2016-03-11 IMAGING — CR DG CHEST 2V
2 series · 2 of 2 positions shown · non-contrast
Comparison: None.

CLINICAL DATA: History of smoking, cough, congestion, some chest
pain

EXAM:
CHEST  2 VIEW

[chest pa]
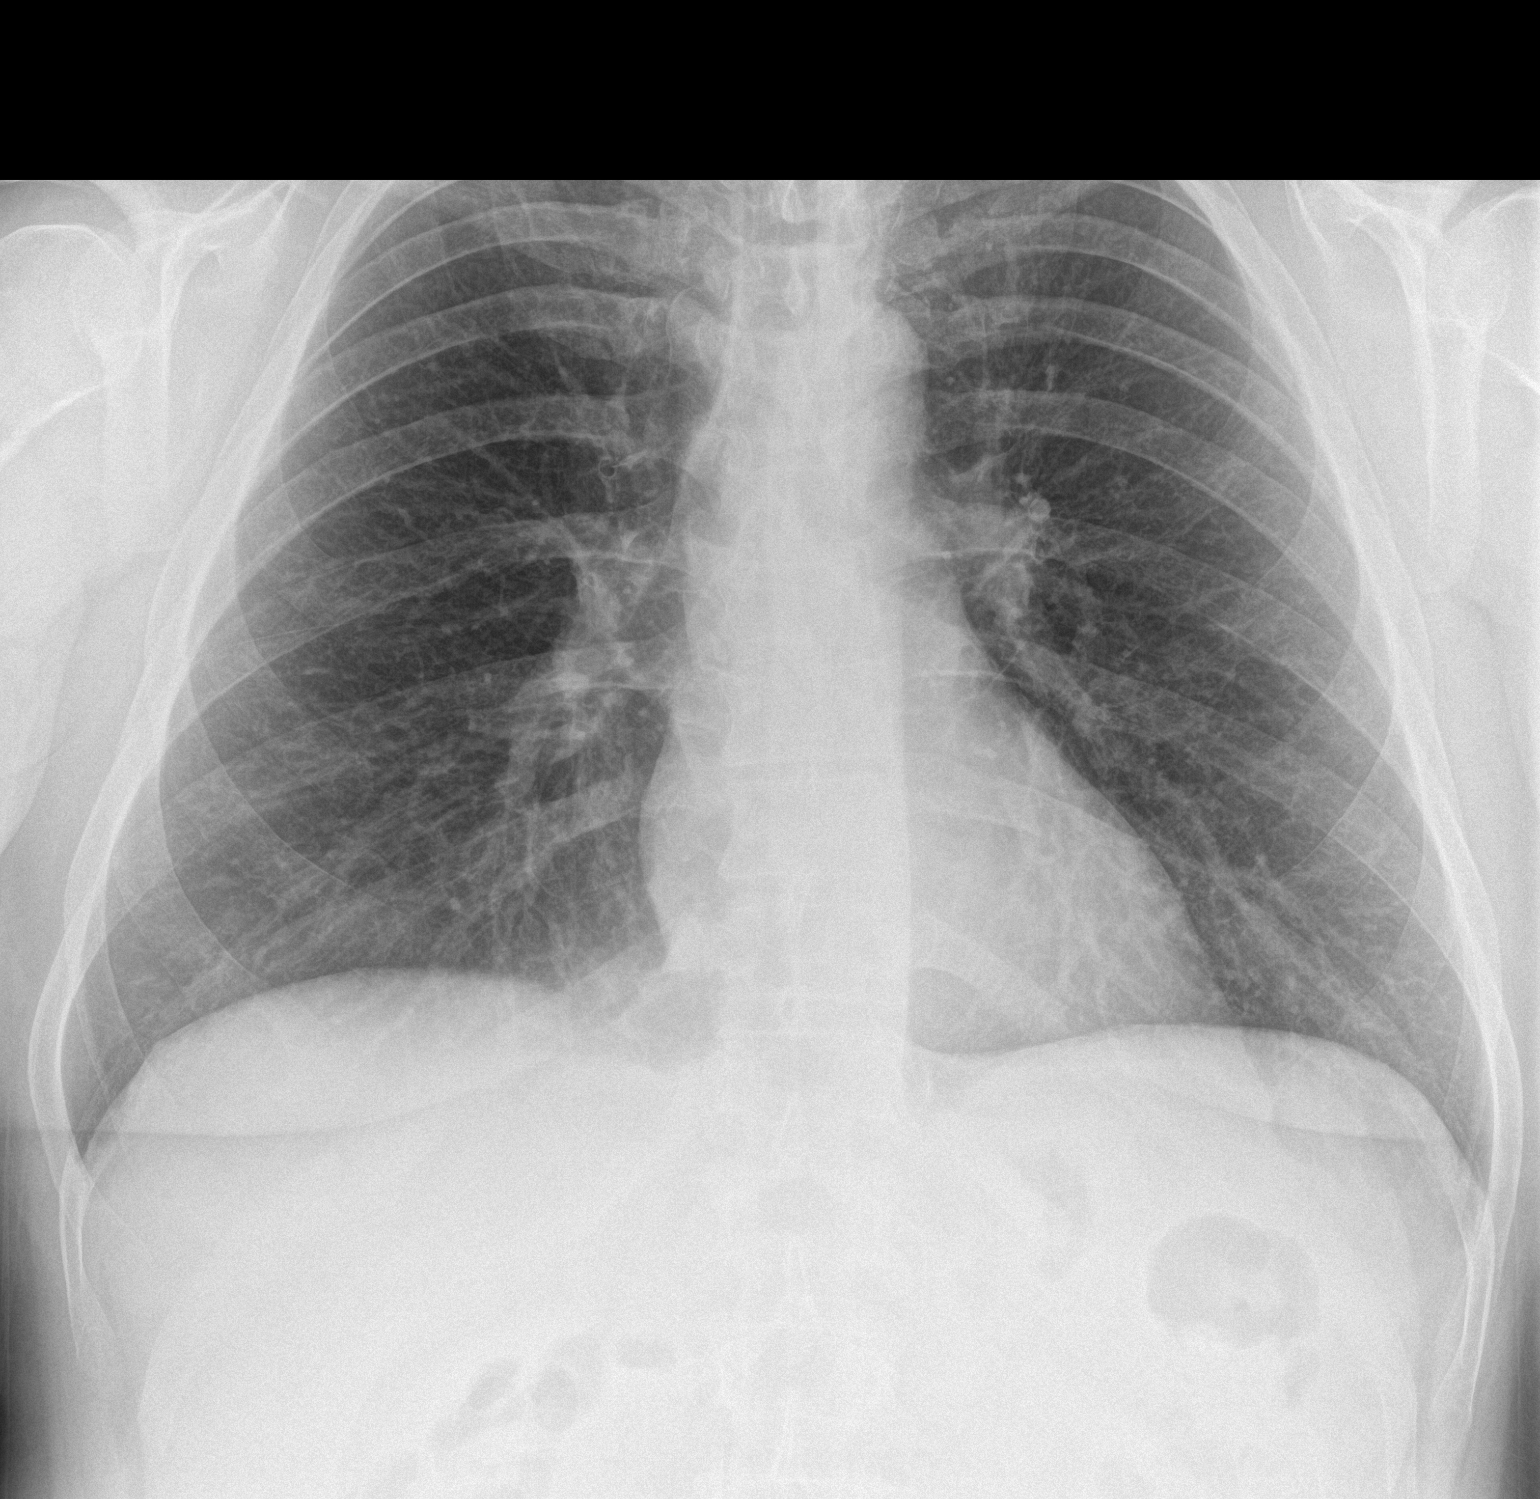

[chest lat]
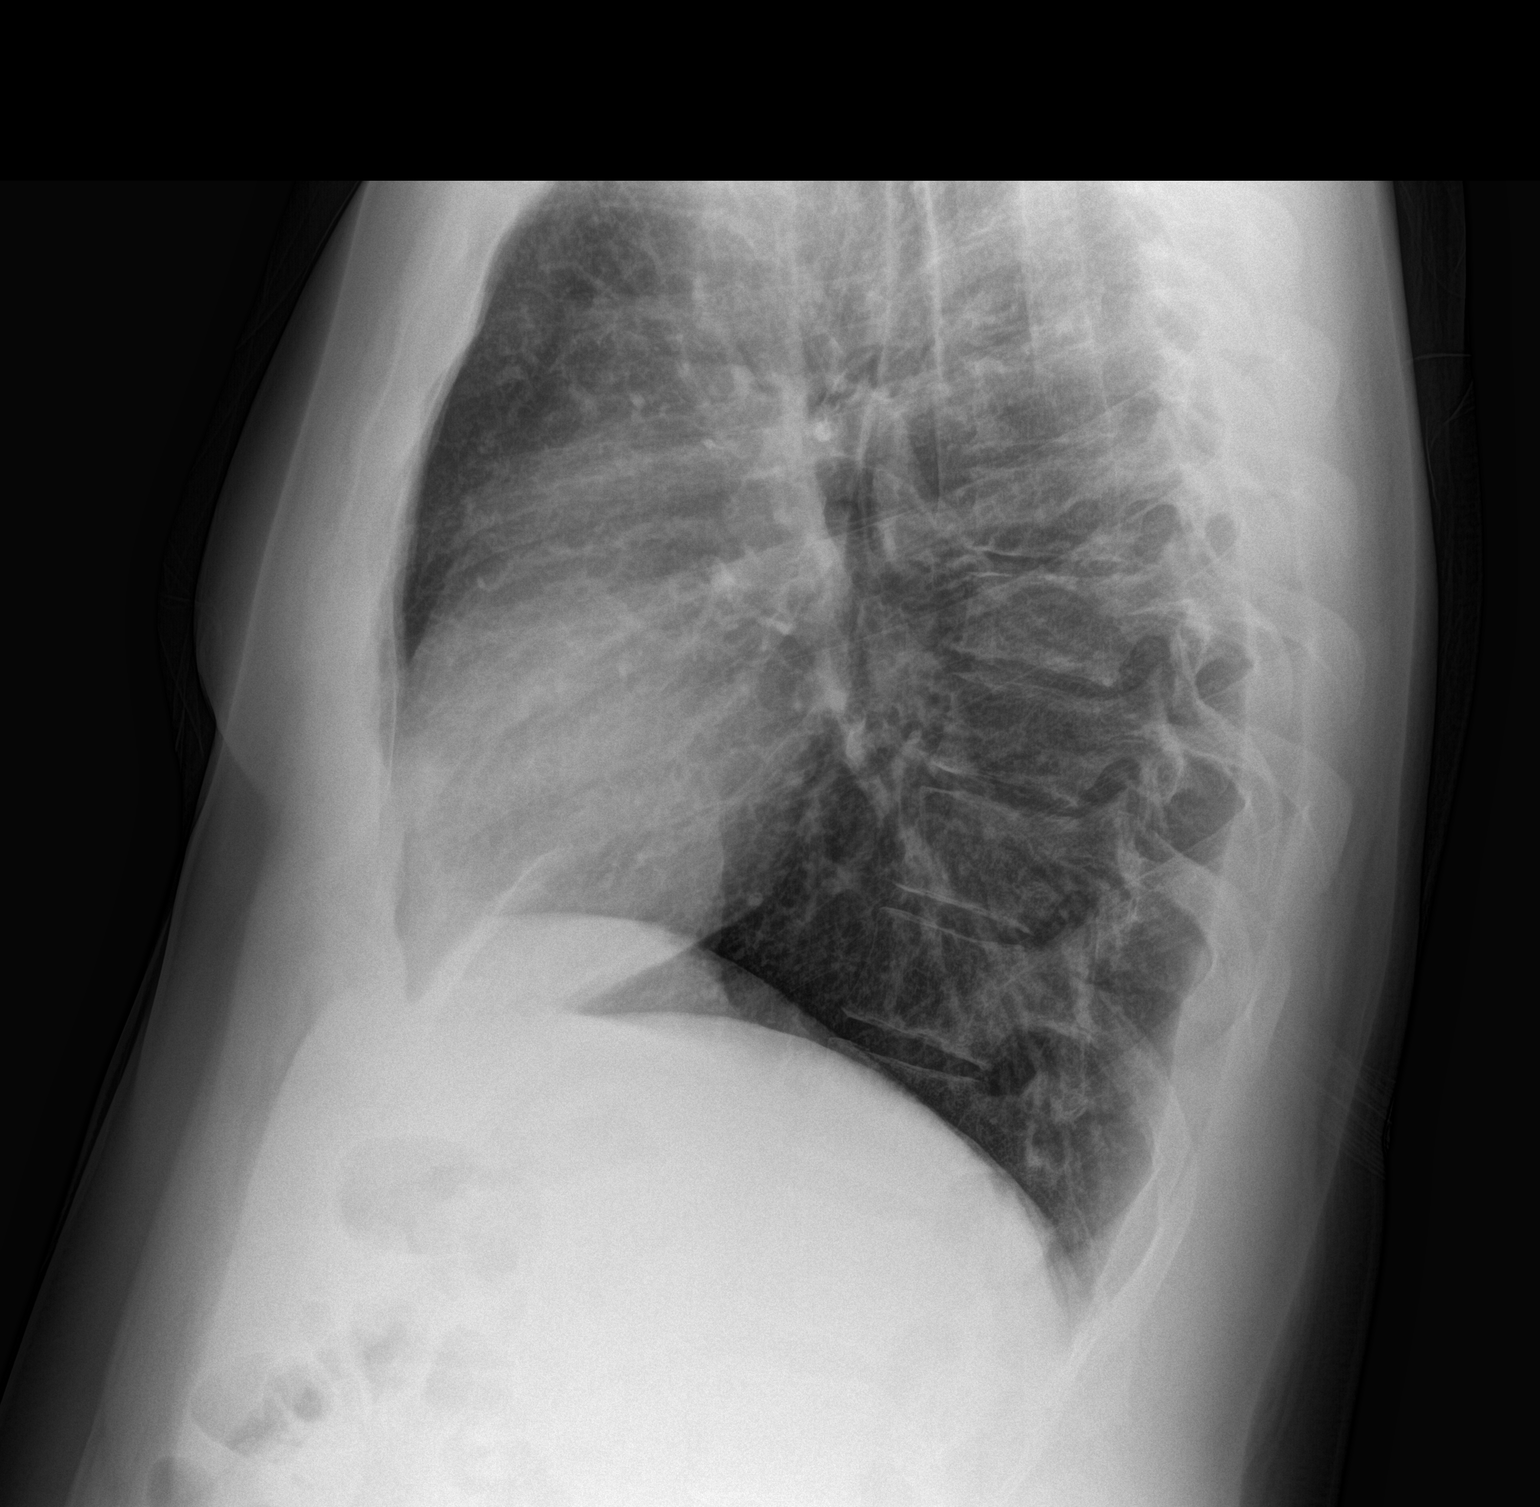

[2 of 2 positions shown; findings below may reference images not displayed]

FINDINGS: No active infiltrate or effusion is seen. Mediastinal and hilar
contours are unremarkable. The heart is within normal limits in
size. No bony abnormality is seen.
IMPRESSION: No active cardiopulmonary disease.
